# Patient Record
Sex: Male | Born: 1995 | Race: Black or African American | Hispanic: No | Marital: Single | Smoking: Former smoker
Health system: Southern US, Community
[De-identification: ages and names within clinical notes are randomized; demographics above are authoritative.]

---
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
## 2014-07-20 IMAGING — CR DG LUMBAR SPINE COMPLETE 4+V
5 series · 5 of 5 positions shown · non-contrast
Comparison: None.

CLINICAL DATA: Pain post trauma

LUMBAR SPINE - COMPLETE 4+ VIEW

[t l-spine a.p.]
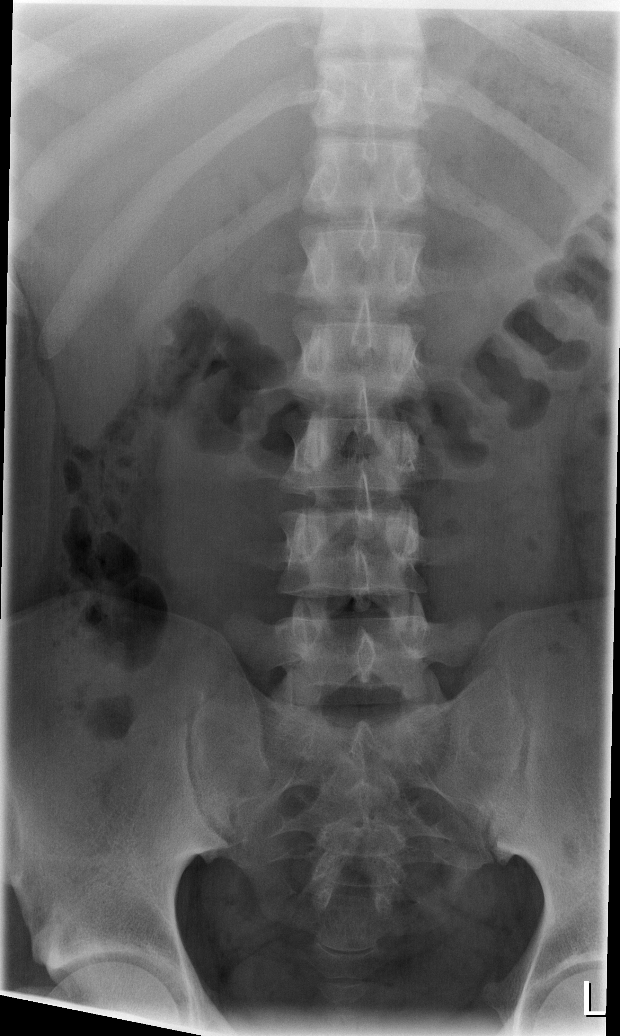

[t l-spine oblique exposure (1 of 2)]
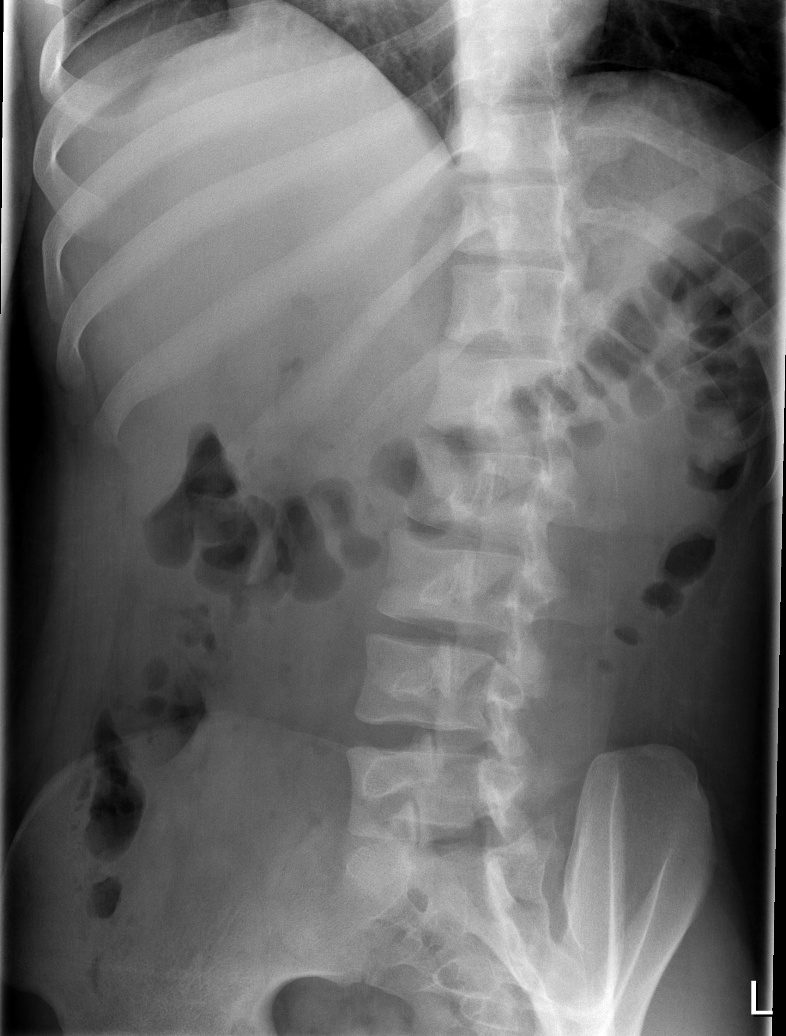

[t l-spine oblique exposure (2 of 2)]
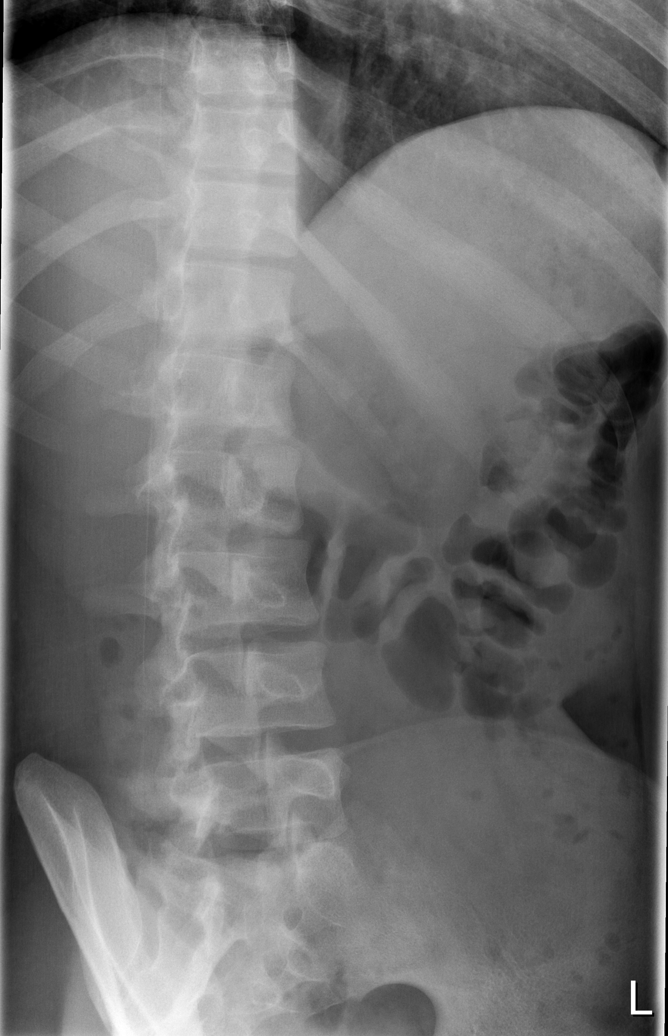

[t l-spine lat]
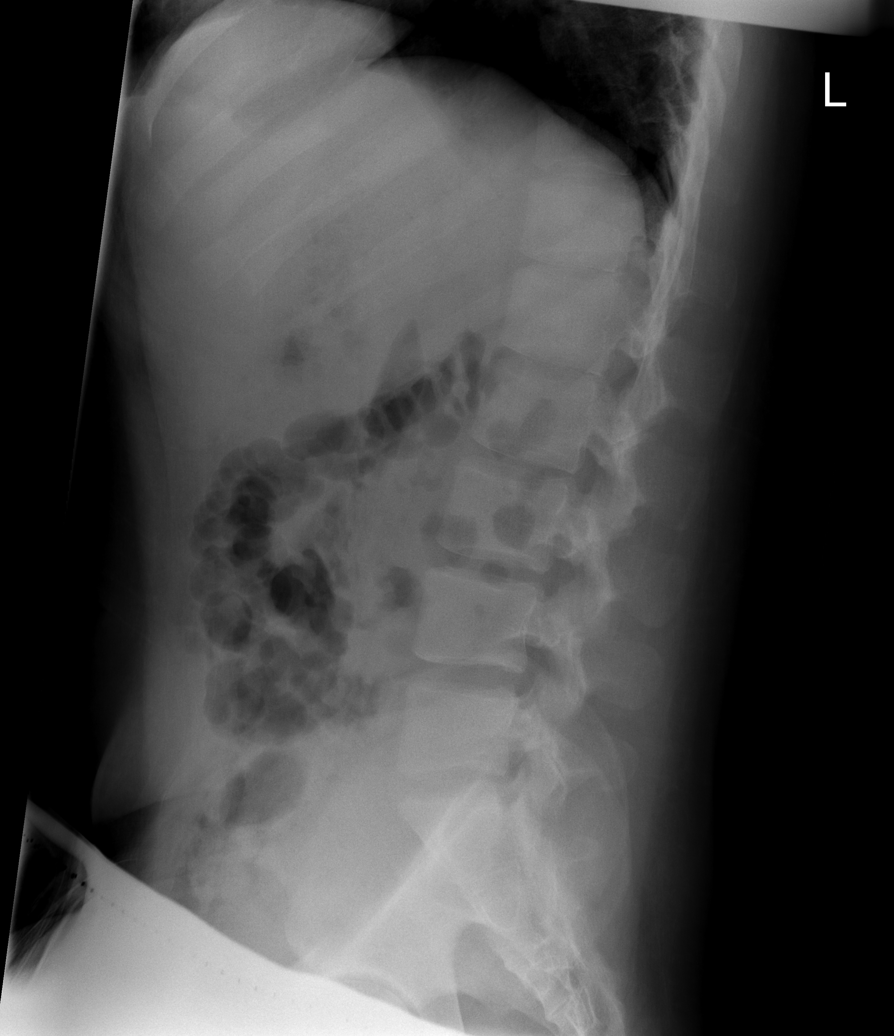

[t l-spine l5-s1 spot]
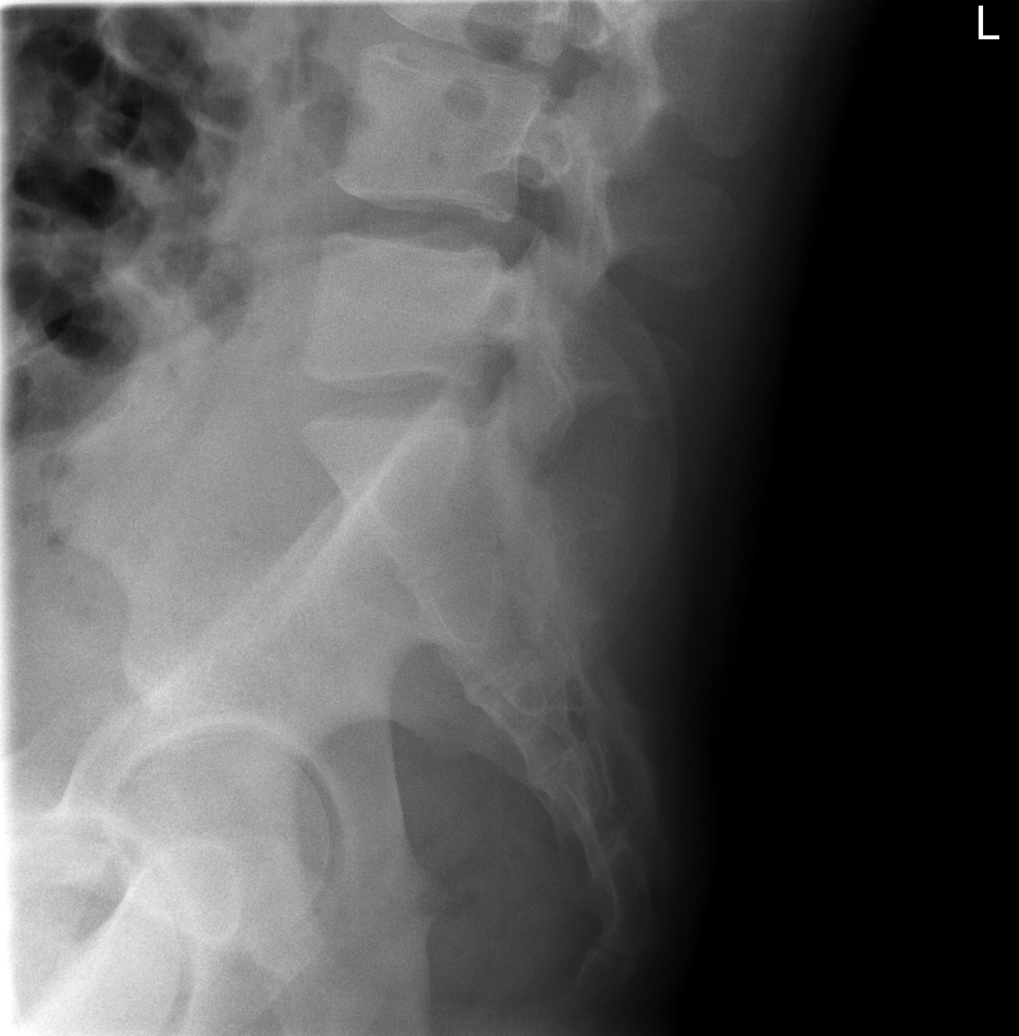

[5 of 5 positions shown; findings below may reference images not displayed]

FINDINGS: Frontal, lateral, bilateral oblique, and spot lumbosacral
lateral images were obtained.  There are five non-rib bearing
lumbar type vertebral bodies.  There is no fracture or
spondylolisthesis.  Disc spaces appear intact.  There is no
appreciable facet arthropathy.
IMPRESSION: No appreciable fracture or arthropathy.

## 2019-02-11 ENCOUNTER — Encounter (HOSPITAL_BASED_OUTPATIENT_CLINIC_OR_DEPARTMENT_OTHER): Payer: Self-pay | Admitting: Emergency Medicine

## 2019-02-11 ENCOUNTER — Emergency Department (HOSPITAL_BASED_OUTPATIENT_CLINIC_OR_DEPARTMENT_OTHER)
Admission: EM | Admit: 2019-02-11 | Discharge: 2019-02-11 | Disposition: A | Discharge status: Home / Self Care | Payer: Self-pay | Attending: Emergency Medicine | Admitting: Emergency Medicine

## 2019-02-11 ENCOUNTER — Other Ambulatory Visit: Payer: Self-pay

## 2019-02-11 DIAGNOSIS — J020 Streptococcal pharyngitis: Secondary | ICD-10-CM

## 2019-02-11 DIAGNOSIS — J0300 Acute streptococcal tonsillitis, unspecified: Secondary | ICD-10-CM | POA: Insufficient documentation

## 2019-02-11 DIAGNOSIS — Z20828 Contact with and (suspected) exposure to other viral communicable diseases: Secondary | ICD-10-CM | POA: Insufficient documentation

## 2019-02-11 DIAGNOSIS — J45909 Unspecified asthma, uncomplicated: Secondary | ICD-10-CM | POA: Insufficient documentation

## 2019-02-11 LAB — GROUP A STREP BY PCR: Group A Strep by PCR: DETECTED — AB

## 2019-02-11 MED ORDER — PENICILLIN V POTASSIUM 500 MG PO TABS: 500.0000 mg | ORAL_TABLET | Freq: Four times a day (QID) | ORAL | 0 refills | Status: AC

## 2019-02-11 NOTE — Discharge Instructions (Addendum)
You were evaluated in the Emergency Department and after careful evaluation, we did not find any emergent condition requiring admission or further testing in the hospital.  Your exam/testing today is overall reassuring.  Your test was positive for strep throat.  Please take the antibiotics as directed and use Tylenol or Motrin at home for discomfort.  We have also tested you for the coronavirus, please practice home quarantine until your test returns.  If positive, you will need to quarantine for a longer period of time per the CDC recommendations.  Please return to the Emergency Department if you experience any worsening of your condition.  We encourage you to follow up with a primary care provider.  Thank you for allowing [place] to be a part of your care.

## 2019-02-11 NOTE — ED Provider Notes (Signed)
[facility]EMERGENC[facility] DEPT [facility] Emergency Department Provider Note MRN:  [id number]  Arrival date & time: 02/11/19     Chief Complaint   Sore Throat   History of Present Illness   David Walter is a 23 y.o. year-old male with a history of asthma presenting to the ED with chief complaint of sore throat.  Mild sore throat for the past 4 days, described as scratchy, worse when swallowing.  Still eating and drinking well.  Denies fever, no cough, no sick contacts, no headache, no chest pain, no shortness of breath, no abdominal pain, no vomiting, no diarrhea.  Review of Systems  A complete 10 system review of systems was obtained and all systems are negative except as noted in the HPI and PMH.   Patient's Health History    Past Medical History:  Diagnosis Date  . Asthma     History reviewed. No pertinent surgical history.  History reviewed. No pertinent family history.  Social History   Socioeconomic History  . Marital status: Single    Spouse name: Not on file  . Number of children: Not on file  . Years of education: Not on file  . Highest education level: Not on file  Occupational History  . Not on file  Social Needs  . Financial resource strain: Not on file  . Food insecurity    Worry: Not on file    Inability: Not on file  . Transportation needs    Medical: Not on file    Non-medical: Not on file  Tobacco Use  . Smoking status: Current Every Day Smoker    Packs/day: 0.50    Types: Cigarettes  . Smokeless tobacco: Never Used  Substance and Sexual Activity  . Alcohol use: Yes  . Drug use: Not Currently    Types: Marijuana  . Sexual activity: Yes  Lifestyle  . Physical activity    Days per week: Not on file    Minutes per session: Not on file  . Stress: Not on file  Relationships  . Social Musician on phone: Not on file    Gets together: Not on file    Attends religious service: Not on file    Active member of club or  organization: Not on file    Attends meetings of clubs or organizations: Not on file    Relationship status: Not on file  . Intimate partner violence    Fear of current or ex partner: Not on file    Emotionally abused: Not on file    Physically abused: Not on file    Forced sexual activity: Not on file  Other Topics Concern  . Not on file  Social History Narrative  . Not on file     Physical Exam  Vital Signs and Nursing Notes reviewed Vitals:   02/11/19 0657 02/11/19 0810  BP: 140/77 124/80  Pulse: 70 78  Resp: 16 16  Temp: 98.3 F (36.8 C) 98.2 F (36.8 C)  SpO2:  100%    CONSTITUTIONAL: Well-appearing, NAD NEURO:  Alert and oriented x 3, no focal deficits EYES:  eyes equal and reactive ENT/NECK:  no LAD, no JVD; bilateral swollen and erythematous tonsils, mild exudate, no asymmetry CARDIO: Regular rate, well-perfused, normal S1 and S2 PULM:  CTAB no wheezing or rhonchi GI/GU:  normal bowel sounds, non-distended, non-tender MSK/SPINE:  No gross deformities, no edema SKIN:  no rash, atraumatic PSYCH:  Appropriate speech and behavior  Diagnostic and Interventional Summary  EKG Interpretation  Date/Time:    Ventricular Rate:    PR Interval:    QRS Duration:   QT Interval:    QTC Calculation:   R Axis:     Text Interpretation:        Labs Reviewed  GROUP A STREP BY PCR - Abnormal; Notable for the following components:      Result Value   Group A Strep by PCR DETECTED (*)    All other components within normal limits  NOVEL CORONAVIRUS, NAA (HOSPITAL ORDER, SEND-OUT TO REF LAB)    No orders to display    Medications - No data to display   Procedures  /  Critical Care Procedures  ED Course and Medical Decision Making  I have reviewed the triage vital signs and the nursing notes.  Pertinent labs & imaging results that were available during my care of the patient were reviewed by me and considered in my medical decision making (see below for details).   Reassuring vital signs, no cough, clear lungs, no increased work of breathing.  Suspect strep throat given the appearance of the tonsils, no signs or symptoms of abscess, awaiting rapid strep.  We will also test for Covid.  David Walter was evaluated in Emergency Department on 02/11/2019 for the symptoms described in the history of present illness. He was evaluated in the context of the global COVID-19 pandemic, which necessitated consideration that the patient might be at risk for infection with the SARS-CoV-2 virus that causes COVID-19. Institutional protocols and algorithms that pertain to the evaluation of patients at risk for COVID-19 are in a state of rapid change based on information released by regulatory bodies including the CDC and federal and state organizations. These policies and algorithms were followed during the patient's care in the ED.  Strep positive, provided with penicillin prescription, appropriate for discharge.  [name]. [place]onia Small, [place] [email or web address]  Final Clinical Impressions(s) / ED Diagnoses     ICD-10-CM   1. Strep throat  J02.0     ED Discharge Orders         Ordered    penicillin v potassium (VEETID) 500 MG tablet  4 times daily     02/11/19 0759          Discharge Instructions Discussed with and Provided to Patient:   Discharge Instructions     You were evaluated in the Emergency Department and after careful evaluation, we did not find any emergent condition requiring admission or further testing in the hospital.  Your exam/testing today is overall reassuring.  Your test was positive for strep throat.  Please take the antibiotics as directed and use Tylenol or Motrin at home for discomfort.  We have also tested you for the coronavirus, please practice home quarantine until your test returns.  If positive, you will need to quarantine for a longer period of time per the CDC recommendations.   Please return to the Emergency Department if you experience any worsening of your condition.  We encourage you to follow up with a primary care provider.  Thank you for allowing [place] to be a part of your care.       Maudie Flakes, MD 02/11/19 [phone]

## 2019-02-11 NOTE — ED Triage Notes (Signed)
Patient arrived via POV c/o sore throat x 3 days. Patient denies fever, nausea, vomiting. Patient states it "feels scratchy" with no pain while swallowing. Patient is AO x 4, normal gait, VS WDL.

## 2019-02-12 LAB — NOVEL CORONAVIRUS, NAA (HOSP ORDER, SEND-OUT TO REF LAB; TAT 18-24 HRS): SARS-CoV-2, NAA: NOT DETECTED

## 2020-04-11 ENCOUNTER — Other Ambulatory Visit: Payer: Self-pay

## 2020-04-11 ENCOUNTER — Encounter (HOSPITAL_BASED_OUTPATIENT_CLINIC_OR_DEPARTMENT_OTHER): Payer: Self-pay | Admitting: *Deleted

## 2020-04-11 ENCOUNTER — Emergency Department (HOSPITAL_BASED_OUTPATIENT_CLINIC_OR_DEPARTMENT_OTHER)
Admission: EM | Admit: 2020-04-11 | Discharge: 2020-04-11 | Disposition: A | Discharge status: Home / Self Care | Payer: Medicaid Other | Attending: Emergency Medicine | Admitting: Emergency Medicine

## 2020-04-11 DIAGNOSIS — R519 Headache, unspecified: Secondary | ICD-10-CM | POA: Insufficient documentation

## 2020-04-11 DIAGNOSIS — R070 Pain in throat: Secondary | ICD-10-CM | POA: Insufficient documentation

## 2020-04-11 DIAGNOSIS — Z5321 Procedure and treatment not carried out due to patient leaving prior to being seen by health care provider: Secondary | ICD-10-CM | POA: Insufficient documentation

## 2020-04-11 DIAGNOSIS — M545 Low back pain, unspecified: Secondary | ICD-10-CM | POA: Insufficient documentation

## 2020-04-11 NOTE — ED Provider Notes (Signed)
Patient left without being seen  1. Patient left without being seen       Melene Plan, DO 04/11/20 2308

## 2020-04-11 NOTE — ED Triage Notes (Signed)
covid symptoms x 6 hrs

## 2020-09-10 ENCOUNTER — Encounter (HOSPITAL_BASED_OUTPATIENT_CLINIC_OR_DEPARTMENT_OTHER): Payer: Self-pay | Admitting: Urology

## 2020-09-10 ENCOUNTER — Emergency Department (HOSPITAL_BASED_OUTPATIENT_CLINIC_OR_DEPARTMENT_OTHER)
Admission: EM | Admit: 2020-09-10 | Discharge: 2020-09-10 | Disposition: A | Discharge status: Home / Self Care | Payer: Medicaid Other | Attending: Emergency Medicine | Admitting: Emergency Medicine

## 2020-09-10 ENCOUNTER — Other Ambulatory Visit: Payer: Self-pay

## 2020-09-10 DIAGNOSIS — R112 Nausea with vomiting, unspecified: Secondary | ICD-10-CM | POA: Insufficient documentation

## 2020-09-10 DIAGNOSIS — F17210 Nicotine dependence, cigarettes, uncomplicated: Secondary | ICD-10-CM | POA: Insufficient documentation

## 2020-09-10 DIAGNOSIS — J45909 Unspecified asthma, uncomplicated: Secondary | ICD-10-CM | POA: Insufficient documentation

## 2020-09-10 DIAGNOSIS — R197 Diarrhea, unspecified: Secondary | ICD-10-CM | POA: Insufficient documentation

## 2020-09-10 LAB — CBC WITH DIFFERENTIAL/PLATELET
Abs Immature Granulocytes: 0.03 10*3/uL (ref 0.00–0.07)
Basophils Absolute: 0 10*3/uL (ref 0.0–0.1)
Basophils Relative: 0 %
Eosinophils Absolute: 0.2 10*3/uL (ref 0.0–0.5)
Eosinophils Relative: 1 %
HCT: 45.3 % (ref 39.0–52.0)
Hemoglobin: 15.4 g/dL (ref 13.0–17.0)
Immature Granulocytes: 0 %
Lymphocytes Relative: 17 %
Lymphs Abs: 2.1 10*3/uL (ref 0.7–4.0)
MCH: 29.1 pg (ref 26.0–34.0)
MCHC: 34 g/dL (ref 30.0–36.0)
MCV: 85.5 fL (ref 80.0–100.0)
Monocytes Absolute: 1.2 10*3/uL — ABNORMAL HIGH (ref 0.1–1.0)
Monocytes Relative: 9 %
Neutro Abs: 9.2 10*3/uL — ABNORMAL HIGH (ref 1.7–7.7)
Neutrophils Relative %: 73 %
Platelets: 329 10*3/uL (ref 150–400)
RBC: 5.3 MIL/uL (ref 4.22–5.81)
RDW: 14.2 % (ref 11.5–15.5)
WBC: 12.8 10*3/uL — ABNORMAL HIGH (ref 4.0–10.5)
nRBC: 0 % (ref 0.0–0.2)

## 2020-09-10 LAB — COMPREHENSIVE METABOLIC PANEL
ALT: 20 U/L (ref 0–44)
AST: 20 U/L (ref 15–41)
Albumin: 4.2 g/dL (ref 3.5–5.0)
Alkaline Phosphatase: 81 U/L (ref 38–126)
Anion gap: 9 (ref 5–15)
BUN: 7 mg/dL (ref 6–20)
CO2: 24 mmol/L (ref 22–32)
Calcium: 9.1 mg/dL (ref 8.9–10.3)
Chloride: 104 mmol/L (ref 98–111)
Creatinine, Ser: 0.73 mg/dL (ref 0.61–1.24)
GFR, Estimated: >60 mL/min (ref >60)
Glucose, Bld: 86 mg/dL (ref 70–99)
Potassium: 3.9 mmol/L (ref 3.5–5.1)
Sodium: 137 mmol/L (ref 135–145)
Total Bilirubin: 1 mg/dL (ref 0.3–1.2)
Total Protein: 7.8 g/dL (ref 6.5–8.1)

## 2020-09-10 MED ORDER — LOPERAMIDE HCL 2 MG PO CAPS
4.0000 mg | ORAL_CAPSULE | Freq: Once | ORAL | Status: AC
  Administered 2020-09-10: 4 mg via ORAL
  Filled 2020-09-10: qty 2

## 2020-09-10 MED ORDER — SODIUM CHLORIDE 0.9 % IV BOLUS
1000.0000 mL | Freq: Once | INTRAVENOUS | Status: AC
  Administered 2020-09-10: 1000 mL via INTRAVENOUS

## 2020-09-10 MED ORDER — LOPERAMIDE HCL 2 MG PO CAPS: 2.0000 mg | ORAL_CAPSULE | Freq: Four times a day (QID) | ORAL | 0 refills | Status: AC | PRN

## 2020-09-10 MED ORDER — ONDANSETRON HCL 4 MG PO TABS: 4.0000 mg | ORAL_TABLET | Freq: Four times a day (QID) | ORAL | 0 refills | Status: AC

## 2020-09-10 MED ORDER — ONDANSETRON HCL 4 MG/2ML IJ SOLN
4.0000 mg | Freq: Once | INTRAMUSCULAR | Status: AC
  Administered 2020-09-10: 4 mg via INTRAVENOUS
  Filled 2020-09-10: qty 2

## 2020-09-10 NOTE — ED Triage Notes (Signed)
Generalized abdominal pain since yesterday, woke up this morning at 0200 with Nausea/Vomiting, states blood noted in vomitus this morning, now having watery stools as well.

## 2020-09-10 NOTE — ED Provider Notes (Signed)
[facility] EMERGENCY DEPARTMENT Provider Note   CSN: [id number] Arrival date & time: 09/10/20  1047     History Chief Complaint  Patient presents with  . Abdominal Pain  . Diarrhea  . Emesis    David Walter is a 25 y.o. male.  He states that he woke up today having several episodes of diarrhea throughout the night and an episode of vomiting today.  He states he was 1 episode of vomiting and it was the food that he had eaten the night prior, but also contained a few "globs of blood at the end."  Patient noticed multiple episodes of loose stools throughout the night as well.  These are nonbloody.  He states he had intermittent abdominal pain, currently denies any abdominal pain.  Denies any fevers or cough.  He states that last night he ate a day old stromboli that was in his oven.        Past Medical History:  Diagnosis Date  . Asthma     There are no problems to display for this patient.   History reviewed. No pertinent surgical history.     History reviewed. No pertinent family history.  Social History   Tobacco Use  . Smoking status: Current Every Day Smoker    Packs/day: 0.50    Types: Cigarettes  . Smokeless tobacco: Never Used  Vaping Use  . Vaping Use: Never used  Substance Use Topics  . Alcohol use: Yes    Comment: occasionally   . Drug use: Yes    Types: Marijuana    Comment: occasionally     Home Medications Prior to Admission medications   Medication Sig Start Date End Date Taking? Authorizing Provider  loperamide (IMODIUM) 2 MG capsule Take 1 capsule (2 mg total) by mouth 4 (four) times daily as needed for diarrhea or loose stools. 09/10/20  Yes Cheryll Cockayne, MD  ondansetron (ZOFRAN) 4 MG tablet Take 1 tablet (4 mg total) by mouth every 6 (six) hours. 09/10/20  Yes Cheryll Cockayne, MD  ibuprofen (ADVIL,MOTRIN) 800 MG tablet Take 1 tablet (800 mg total) by mouth 3 (three) times daily. 11/17/12   Glynn Octave, MD    Allergies     Patient has no known allergies.  Review of Systems   Review of Systems  Constitutional: Negative for fever.  HENT: Negative for ear pain and sore throat.   Eyes: Negative for pain.  Respiratory: Negative for cough.   Cardiovascular: Negative for chest pain.  Gastrointestinal: Positive for diarrhea.  Genitourinary: Negative for flank pain.  Musculoskeletal: Negative for back pain.  Skin: Negative for color change and rash.  Neurological: Negative for syncope.  All other systems reviewed and are negative.   Physical Exam Updated Vital Signs BP 129/78 (BP Location: Right Arm)   Pulse 82   Temp 98.8 F (37.1 C) (Oral)   Resp 18   Ht 5\' 11"  (1.803 m)   Wt 99.8 kg   SpO2 99%   BMI 30.68 kg/m   Physical Exam Constitutional:      General: He is not in acute distress.    Appearance: He is well-developed.  HENT:     Head: Normocephalic.     Nose: Nose normal.  Eyes:     Extraocular Movements: Extraocular movements intact.  Cardiovascular:     Rate and Rhythm: Normal rate.  Pulmonary:     Effort: Pulmonary effort is normal.  Abdominal:     Tenderness: There is no abdominal  tenderness. There is no guarding or rebound.  Skin:    Coloration: Skin is not jaundiced.  Neurological:     Mental Status: He is alert. Mental status is at baseline.     ED Results / Procedures / Treatments   Labs (all labs ordered are listed, but only abnormal results are displayed) Labs Reviewed  CBC WITH DIFFERENTIAL/PLATELET - Abnormal; Notable for the following components:      Result Value   WBC 12.8 (*)    Neutro Abs 9.2 (*)    Monocytes Absolute 1.2 (*)    All other components within normal limits  COMPREHENSIVE METABOLIC PANEL    EKG None  Radiology No results found.  Procedures Procedures   Medications Ordered in ED Medications  sodium chloride 0.9 % bolus 1,000 mL (1,000 mLs Intravenous New Bag/Given 09/10/20 1121)  ondansetron (ZOFRAN) injection 4 mg (4 mg Intravenous  Given 09/10/20 1121)  loperamide (IMODIUM) capsule 4 mg (4 mg Oral Given 09/10/20 1135)    ED Course  I have reviewed the triage vital signs and the nursing notes.  Pertinent labs & imaging results that were available during my care of the patient were reviewed by me and considered in my medical decision making (see chart for details).    MDM Rules/Calculators/A&P                          Abdominal exam is benign with no guarding or tenderness.  Serial exams are done, last exam done at 12:00 with no guarding or tenderness was elicited.  Labs unremarkable like a mildly elevated 12.8 which is suspect due to his vomiting and DIARRHEA.  I doubt acute appendicitis given no localizing abdominal pain or tenderness at this time.  Recommending Zofran and Imodium at home, advised immediate return for worsening pain fevers or any additional concerns.   Final Clinical Impression(s) / ED Diagnoses Final diagnoses:  Nausea vomiting and diarrhea    Rx / DC Orders ED Discharge Orders         Ordered    ondansetron (ZOFRAN) 4 MG tablet  Every 6 hours        09/10/20 1209    loperamide (IMODIUM) 2 MG capsule  4 times daily PRN        09/10/20 1209           Cheryll Cockayne, MD 09/10/20 1210

## 2020-09-10 NOTE — Discharge Instructions (Signed)
Call your primary care doctor or specialist as discussed in the next 2-3 days.   Return immediately back to the ER if:  Your symptoms worsen within the next 12-24 hours. You develop new symptoms such as new fevers, persistent vomiting, new pain, shortness of breath, or new weakness or numbness, or if you have any other concerns.  

## 2021-05-06 ENCOUNTER — Emergency Department (HOSPITAL_BASED_OUTPATIENT_CLINIC_OR_DEPARTMENT_OTHER)
Admission: EM | Admit: 2021-05-06 | Discharge: 2021-05-06 | Disposition: A | Discharge status: Home / Self Care | Payer: Medicaid Other | Attending: Emergency Medicine | Admitting: Emergency Medicine

## 2021-05-06 ENCOUNTER — Encounter (HOSPITAL_BASED_OUTPATIENT_CLINIC_OR_DEPARTMENT_OTHER): Payer: Self-pay | Admitting: Emergency Medicine

## 2021-05-06 ENCOUNTER — Other Ambulatory Visit: Payer: Self-pay

## 2021-05-06 DIAGNOSIS — S0992XA Unspecified injury of nose, initial encounter: Secondary | ICD-10-CM | POA: Insufficient documentation

## 2021-05-06 DIAGNOSIS — X58XXXA Exposure to other specified factors, initial encounter: Secondary | ICD-10-CM | POA: Insufficient documentation

## 2021-05-06 DIAGNOSIS — R519 Headache, unspecified: Secondary | ICD-10-CM | POA: Insufficient documentation

## 2021-05-06 NOTE — ED Provider Notes (Signed)
[facility] EMERGENCY DEPARTMENT Provider Note   CSN: [id number] Arrival date & time: 05/06/21  1102     History  Chief Complaint  Patient presents with   Facial Pain   Epistaxis    David Walter is a 26 y.o. male.  Patient presents ER chief complaint of concern for nose injury due to cocaine use.  He states that has been using cocaine regularly for quite a while now.  He has noticed some discoloration and intermittent bleeding from the septum of his nose for the past 3 to 4 weeks.  He is concerned that he may have developed a hole through the septum and he presents to the ER today.  No reports of fevers or cough no vomiting or diarrhea last use of cocaine was early this morning.      Home Medications Prior to Admission medications   Medication Sig Start Date End Date Taking? Authorizing Provider  ibuprofen (ADVIL,MOTRIN) 800 MG tablet Take 1 tablet (800 mg total) by mouth 3 (three) times daily. 11/17/12   Rancour, Jeannett Senior, MD  loperamide (IMODIUM) 2 MG capsule Take 1 capsule (2 mg total) by mouth 4 (four) times daily as needed for diarrhea or loose stools. 09/10/20   Cheryll Cockayne, MD  ondansetron (ZOFRAN) 4 MG tablet Take 1 tablet (4 mg total) by mouth every 6 (six) hours. 09/10/20   Cheryll Cockayne, MD      Allergies    Patient has no known allergies.    Review of Systems   Review of Systems  Constitutional:  Negative for fever.  HENT:  Negative for ear pain and sore throat.   Eyes:  Negative for pain.  Respiratory:  Negative for cough.   Cardiovascular:  Negative for chest pain.  Gastrointestinal:  Negative for abdominal pain.  Genitourinary:  Negative for flank pain.  Musculoskeletal:  Negative for back pain.  Skin:  Negative for color change and rash.  Neurological:  Negative for syncope.  All other systems reviewed and are negative.  Physical Exam Updated Vital Signs BP (!) 143/95    Pulse 99    Temp 98.5 F (36.9 C) (Oral)    Resp 18    Ht 5\' 11"   (1.803 m)    Wt 99.8 kg    SpO2 100%    BMI 30.68 kg/m  Physical Exam Constitutional:      Appearance: He is well-developed.  HENT:     Head: Normocephalic.     Nose: Nose normal.     Comments: Grayish discoloration of the septum seen from bilateral nares.  No active bleeding noted.  No clear perforation noted.  No septal hematoma noted. Eyes:     Extraocular Movements: Extraocular movements intact.  Cardiovascular:     Rate and Rhythm: Normal rate.  Pulmonary:     Effort: Pulmonary effort is normal.  Skin:    Coloration: Skin is not jaundiced.  Neurological:     Mental Status: He is alert. Mental status is at baseline.    ED Results / Procedures / Treatments   Labs (all labs ordered are listed, but only abnormal results are displayed) Labs Reviewed - No data to display  EKG None  Radiology No results found.  Procedures Procedures    Medications Ordered in ED Medications - No data to display  ED Course/ Medical Decision Making/ A&P  Medical Decision Making  Patient counseled against cocaine use.  Recommend outpatient follow-up with ENT this week.  Advised return for worsening symptoms or any additional concerns.        Final Clinical Impression(s) / ED Diagnoses Final diagnoses:  Injury of nasal septum, initial encounter    Rx / DC Orders ED Discharge Orders     None         Cheryll Cockayne, MD 05/06/21 1213

## 2021-05-06 NOTE — ED Triage Notes (Signed)
Pt arrives pov, c/o sinus pain and congestion, with blood tinged mucus today. Pt endorse hx of cocaine abuse, endorses concern of septum injury. Last use 8 hrs pta.

## 2021-05-06 NOTE — Discharge Instructions (Signed)
Call the ENT physician to schedule an appointment.  Return back to the ER if you have worsening symptoms or any additional concerns.

## 2023-10-09 ENCOUNTER — Emergency Department (HOSPITAL_BASED_OUTPATIENT_CLINIC_OR_DEPARTMENT_OTHER)
Admission: EM | Admit: 2023-10-09 | Discharge: 2023-10-09 | Disposition: A | Discharge status: Home / Self Care | Payer: Self-pay | Attending: Emergency Medicine | Admitting: Emergency Medicine

## 2023-10-09 ENCOUNTER — Encounter (HOSPITAL_BASED_OUTPATIENT_CLINIC_OR_DEPARTMENT_OTHER): Payer: Self-pay

## 2023-10-09 ENCOUNTER — Other Ambulatory Visit: Payer: Self-pay

## 2023-10-09 DIAGNOSIS — K625 Hemorrhage of anus and rectum: Secondary | ICD-10-CM | POA: Insufficient documentation

## 2023-10-09 LAB — CBC
HCT: 42.9 % (ref 39.0–52.0)
Hemoglobin: 14.7 g/dL (ref 13.0–17.0)
MCH: 28.7 pg (ref 26.0–34.0)
MCHC: 34.3 g/dL (ref 30.0–36.0)
MCV: 83.8 fL (ref 80.0–100.0)
Platelets: 325 10*3/uL (ref 150–400)
RBC: 5.12 MIL/uL (ref 4.22–5.81)
RDW: 13.6 % (ref 11.5–15.5)
WBC: 14 10*3/uL — ABNORMAL HIGH (ref 4.0–10.5)
nRBC: 0 % (ref 0.0–0.2)

## 2023-10-09 LAB — COMPREHENSIVE METABOLIC PANEL WITH GFR
ALT: 13 U/L (ref 0–44)
AST: 19 U/L (ref 15–41)
Albumin: 5 g/dL (ref 3.5–5.0)
Alkaline Phosphatase: 97 U/L (ref 38–126)
Anion gap: 12 (ref 5–15)
BUN: 7 mg/dL (ref 6–20)
CO2: 25 mmol/L (ref 22–32)
Calcium: 9.9 mg/dL (ref 8.9–10.3)
Chloride: 100 mmol/L (ref 98–111)
Creatinine, Ser: 0.77 mg/dL (ref 0.61–1.24)
GFR, Estimated: >60 mL/min (ref >60)
Glucose, Bld: 92 mg/dL (ref 70–99)
Potassium: 3.8 mmol/L (ref 3.5–5.1)
Sodium: 137 mmol/L (ref 135–145)
Total Bilirubin: 0.8 mg/dL (ref 0.0–1.2)
Total Protein: 8 g/dL (ref 6.5–8.1)

## 2023-10-09 LAB — OCCULT BLOOD X 1 CARD TO LAB, STOOL: Fecal Occult Bld: POSITIVE — AB

## 2023-10-09 MED ORDER — POLYETHYLENE GLYCOL 3350 17 GM/SCOOP PO POWD: 1.0000 g | Freq: Every day | ORAL | 0 refills | Status: AC

## 2023-10-09 NOTE — ED Provider Notes (Signed)
 [place] [facility]ERGENC[facility] DEPARTMENT AT [facility] Provider Note   CSN: [id number] Arrival date & time: 10/09/23  1134     Patient presents with: Blood In Stools   David Walter is a 28 y.o. male.   HPI  Patient is a 28 year old male with no pertinent past medical history presents emergency room today with complaints of approximately 1 month of BRBPR  Patient states that he does strain when he poops.  He denies any rectal pain.  No anal receptive sex.  He has not had any abdominal pain chest pain difficulty breathing no lightheadedness or dizziness.  He states that he came to the emergency room today because of the persistence of having blood in his stool.  He states that his intermittent - is not present every BM - but is present nearly daily.     Prior to Admission medications   Medication Sig Start Date End Date Taking? Authorizing Provider  polyethylene glycol powder (GLYCOLAX/MIRALAX) 17 GM/SCOOP powder Take 1 g by mouth daily. One scoop in beverage of your choice with each meal. You may increase if you continue to be constipated and decrease if your stool becomes too loose. 10/09/23 02/05/24 Yes Kaleeah Gingerich S, PA  ibuprofen  (ADVIL ,MOTRIN ) 800 MG tablet Take 1 tablet (800 mg total) by mouth 3 (three) times daily. 11/17/12   Rancour, Garnette, MD  loperamide  (IMODIUM ) 2 MG capsule Take 1 capsule (2 mg total) by mouth 4 (four) times daily as needed for diarrhea or loose stools. 09/10/20   Phebe Fonda RAMAN, MD  ondansetron  (ZOFRAN ) 4 MG tablet Take 1 tablet (4 mg total) by mouth every 6 (six) hours. 09/10/20   Phebe Fonda RAMAN, MD    Allergies: Patient has no known allergies.    Review of Systems  Updated Vital Signs BP 123/79 (BP Location: Right Arm)   Pulse 76   Temp 98.3 F (36.8 C) (Oral)   Resp 16   Wt 81.6 kg   SpO2 99%   BMI 25.10 kg/m   Physical Exam Vitals and nursing note reviewed.  Constitutional:      General: He is not in acute distress.    Comments:  Pleasant well-appearing 28 year old.  In no acute distress.  Sitting comfortably in bed.  Able answer questions appropriately follow commands. No increased work of breathing. Speaking in full sentences.   HENT:     Head: Normocephalic and atraumatic.     Nose: Nose normal.     Mouth/Throat:     Mouth: Mucous membranes are moist.  Eyes:     General: No scleral icterus. Cardiovascular:     Rate and Rhythm: Normal rate and regular rhythm.     Pulses: Normal pulses.     Heart sounds: Normal heart sounds.  Pulmonary:     Effort: Pulmonary effort is normal. No respiratory distress.     Breath sounds: No wheezing.  Abdominal:     Palpations: Abdomen is soft.     Tenderness: There is no abdominal tenderness. There is no guarding or rebound.  Genitourinary:    Comments: RN Wes present for rectal exam  Empty rectal vault Positive hemoccult  Musculoskeletal:     Cervical back: Normal range of motion.     Right lower leg: No edema.     Left lower leg: No edema.  Skin:    General: Skin is warm and dry.     Capillary Refill: Capillary refill takes less than 2 seconds.  Neurological:  Mental Status: He is alert. Mental status is at baseline.  Psychiatric:        Mood and Affect: Mood normal.        Behavior: Behavior normal.     (all labs ordered are listed, but only abnormal results are displayed) Labs Reviewed  CBC - Abnormal; Notable for the following components:      Result Value   WBC 14.0 (*)    All other components within normal limits  OCCULT BLOOD X 1 CARD TO LAB, STOOL - Abnormal; Notable for the following components:   Fecal Occult Bld POSITIVE (*)    All other components within normal limits  COMPREHENSIVE METABOLIC PANEL WITH GFR  POC OCCULT BLOOD, ED    EKG: None  Radiology: No results found.   Procedures   Medications Ordered in the ED - No data to display  Clinical Course as of 10/09/23 1729  Wed Oct 09, 2023  1243 Intermittent blood (BRBPR) for 1  month Drops into toilet bowl.  Sometimes strains when poops.  No rectal pain.  [WF]    Clinical Course User Index [WF] Neldon Hamp RAMAN, PA                                 Medical Decision Making Amount and/or Complexity of Data Reviewed Labs: ordered.   Patient is a 28 year old male with no pertinent past medical history presents emergency room today with complaints of approximately 1 month of BRBPR  Patient states that he does strain when he poops.  He denies any rectal pain.  No anal receptive sex.  He has not had any abdominal pain chest pain difficulty breathing no lightheadedness or dizziness.  He states that he came to the emergency room today because of the persistence of having blood in his stool.  He states that his intermittent - is not present every BM - but is present nearly daily.  Exam without abdominal tenderness.  No guarding or rebound.  CBC with hemoglobin at approximately baseline at 14.7, positive occult stool card however no BRBPR on my rectal exam.  Empty rectal vault.  CMP NML  Patient given strict return precautions to the emergency department.  He is with normal vital signs, well-appearing, no abdominal tenderness.  Hemoglobin is reassuring.  Stool exam unremarkable but that is positive and patient has been having BRBPR therefore GI follow-up.  Return precautions were able to be taught back by patient.  Will start him on stool softener.   Final diagnoses:  Rectal bleeding    ED Discharge Orders          Ordered    polyethylene glycol powder (GLYCOLAX/MIRALAX) 17 GM/SCOOP powder  Daily        10/09/23 1446    Ambulatory referral to Gastroenterology        10/09/23 1527               Neldon Hamp RAMAN, [place] 10/09/23 1730    Bari Roxie HERO, DO 10/10/23 1414

## 2023-10-09 NOTE — Discharge Instructions (Addendum)
 You will need to follow-up with a gastroenterologist.  I have given you the information for a gastroenterologist here in [facility].  Please call to make an appointment.  Please use MiraLAX 1 scoop daily and titrate up to 3 scoops daily as needed for soft stool.  Space these out so you are taking them with breakfast lunch and dinner.  Make sure you are drinking plenty of water.  Return to the emergency room for new or concerning symptoms such as difficulty breathing lightheadedness dizziness or any other new or concerning symptoms.

## 2023-10-09 NOTE — ED Triage Notes (Signed)
 Pt reports for the past 2 months has seen blood in stools. Described as bright red in toilet. Over the past week blood with every BM. Denies pain. Blood noted in stool today

## 2023-10-09 NOTE — ED Notes (Signed)
 2x attempts by techs for blood without success.

## 2023-10-09 NOTE — ED Notes (Signed)
 Witnessed provider for rectal exam and sample POC.

## 2024-02-11 ENCOUNTER — Encounter (HOSPITAL_BASED_OUTPATIENT_CLINIC_OR_DEPARTMENT_OTHER): Payer: Self-pay

## 2024-02-11 ENCOUNTER — Other Ambulatory Visit: Payer: Self-pay

## 2024-02-11 ENCOUNTER — Emergency Department (HOSPITAL_BASED_OUTPATIENT_CLINIC_OR_DEPARTMENT_OTHER)
Admission: EM | Admit: 2024-02-11 | Discharge: 2024-02-11 | Disposition: A | Discharge status: Home / Self Care | Payer: Self-pay | Attending: Emergency Medicine | Admitting: Emergency Medicine

## 2024-02-11 DIAGNOSIS — J069 Acute upper respiratory infection, unspecified: Secondary | ICD-10-CM | POA: Insufficient documentation

## 2024-02-11 DIAGNOSIS — J45909 Unspecified asthma, uncomplicated: Secondary | ICD-10-CM | POA: Insufficient documentation

## 2024-02-11 LAB — RESP PANEL BY RT-PCR (RSV, FLU A&B, COVID)  RVPGX2
Influenza A by PCR: NEGATIVE
Influenza B by PCR: NEGATIVE
Resp Syncytial Virus by PCR: NEGATIVE
SARS Coronavirus 2 by RT PCR: NEGATIVE

## 2024-02-11 LAB — GROUP A STREP BY PCR: Group A Strep by PCR: NOT DETECTED

## 2024-02-11 MED ORDER — BENZOCAINE 20 % MT AERO
INHALATION_SPRAY | Freq: Once | OROMUCOSAL | Status: AC
  Administered 2024-02-11: 1 via OROMUCOSAL
  Filled 2024-02-11: qty 57

## 2024-02-11 MED ORDER — ACETAMINOPHEN 325 MG PO TABS
650.0000 mg | ORAL_TABLET | Freq: Once | ORAL | Status: AC
  Administered 2024-02-11: 650 mg via ORAL
  Filled 2024-02-11: qty 2

## 2024-02-11 MED ORDER — BENZONATATE 100 MG PO CAPS: 100.0000 mg | ORAL_CAPSULE | Freq: Three times a day (TID) | ORAL | 0 refills | Status: AC

## 2024-02-11 NOTE — ED Triage Notes (Signed)
 C/o fatigue x 1 week, cough, sore throat, loss of taste for a few days.

## 2024-02-11 NOTE — ED Provider Notes (Signed)
 [place] [facility]ENC[facility] DE[facility]MENT AT [facility] Provider Note   CSN: [id number] Arrival date & time: 02/11/24  9177     Patient presents with: URI   David Walter is a 28 y.o. male.   URI Presenting symptoms: congestion, cough and sore throat    Patient is a 28 year old male presenting ED today for concerns for cough, congestion, odynophagia, generalized bodyaches and fatigue present x 1 week.  Additionally has noted that David Walter has lost a sense of taste.  No known sick contacts at work.  Notes that cough has been productive, green sputum.  Has not needed to use albuterol inhaler.  Previous medical history of asthma  Denies fever, headache, vision changes, otalgia, dysphagia, chest pain, shortness of breath, abdominal pain, nausea, vomiting, diarrhea, hematochezia, melena, dysuria, rashes, lower leg swelling.    Prior to Admission medications   Medication Sig Start Date End Date Taking? Authorizing Provider  benzonatate (TESSALON) 100 MG capsule Take 1 capsule (100 mg total) by mouth every 8 (eight) hours. 02/11/24  Yes Lakisa Lotz S, PA-C  ibuprofen  (ADVIL ,MOTRIN ) 800 MG tablet Take 1 tablet (800 mg total) by mouth 3 (three) times daily. 11/17/12   Rancour, Garnette, MD  loperamide  (IMODIUM ) 2 MG capsule Take 1 capsule (2 mg total) by mouth 4 (four) times daily as needed for diarrhea or loose stools. 09/10/20   Phebe Fonda RAMAN, MD  ondansetron  (ZOFRAN ) 4 MG tablet Take 1 tablet (4 mg total) by mouth every 6 (six) hours. 09/10/20   Phebe Fonda RAMAN, MD    Allergies: Patient has no known allergies.    Review of Systems  HENT:  Positive for congestion and sore throat.   Respiratory:  Positive for cough.   All other systems reviewed and are negative.   Updated Vital Signs BP 117/77 (BP Location: Right Arm)   Pulse 96   Temp 99.2 F (37.3 C) (Oral)   Resp 16   Ht 5' 11 (1.803 m)   SpO2 98%   BMI 25.10 kg/m   Physical Exam Vitals and nursing note reviewed.   Constitutional:      General: David Walter is not in acute distress.    Appearance: Normal appearance. David Walter is not ill-appearing or diaphoretic.  HENT:     Head: Normocephalic and atraumatic.     Right Ear: Tympanic membrane, ear canal and external ear normal.     Left Ear: Tympanic membrane, ear canal and external ear normal.     Nose: Congestion and rhinorrhea present.     Mouth/Throat:     Mouth: Mucous membranes are moist.     Pharynx: Oropharyngeal exudate and posterior oropharyngeal erythema present.     Comments: Noted to have white exudate on tonsils bilaterally, 1+, with erythema.  Uvula is midline, no visible abscess or voice change present. Eyes:     General: No scleral icterus.       Right eye: No discharge.        Left eye: No discharge.     Extraocular Movements: Extraocular movements intact.     Conjunctiva/sclera: Conjunctivae normal.     Pupils: Pupils are equal, round, and reactive to light.  Cardiovascular:     Rate and Rhythm: Normal rate and regular rhythm.     Pulses: Normal pulses.     Heart sounds: Normal heart sounds. No murmur heard.    No friction rub. No gallop.  Pulmonary:     Effort: Pulmonary effort is normal. No respiratory distress.  Breath sounds: No stridor. No wheezing, rhonchi or rales.  Chest:     Chest wall: No tenderness.  Abdominal:     General: Abdomen is flat. There is no distension.     Palpations: Abdomen is soft.     Tenderness: There is no abdominal tenderness. There is no right CVA tenderness, left CVA tenderness, guarding or rebound.  Musculoskeletal:        General: No swelling, deformity or signs of injury.     Cervical back: Normal range of motion and neck supple. No rigidity or tenderness.     Right lower leg: No edema.     Left lower leg: No edema.  Lymphadenopathy:     Cervical: Cervical adenopathy (Anterior) present.  Skin:    General: Skin is warm and dry.     Findings: No bruising, erythema or lesion.  Neurological:      General: No focal deficit present.     Mental Status: David Walter is alert and oriented to person, place, and time.     Motor: No weakness.     Coordination: Coordination normal.     Gait: Gait normal.  Psychiatric:        Mood and Affect: Mood normal.     (all labs ordered are listed, but only abnormal results are displayed) Labs Reviewed  RESP PANEL BY RT-PCR (RSV, FLU A&B, COVID)  [id number]  GROUP A STREP BY PCR    EKG: None  Radiology: No results found.  Procedures   Medications Ordered in the ED  Benzocaine (HURRCAINE) 20 % mouth spray (1 Application Mouth/Throat Given 02/11/24 0926)  acetaminophen (TYLENOL) tablet 650 mg (650 mg Oral Given 02/11/24 0926)    Medical Decision Making  This patient is a 28 year old male who presents to the ED for concern of cough, congestion, odynophagia, loss of taste, generalized bodyaches and fatigue x 1 week.  No known sick contacts.  On physical exam, patient is in no acute distress, afebrile, alert and orient x 4, speaking in full sentences, nontachypneic, nontachycardic.  LCTAB, RRR, no murmur, no abdominal tenderness to palpation, no CVA tenderness.  Outside of loss of smell, normal neuroexam.  Oropharynx notably did have mild tonsillar swelling, 1+ bilaterally as well as white exudate and erythema present.  Congestion also present in nasal passages.  Unremarkable exam otherwise.  With patient's well-appearing presentation, URI-like symptoms and having tonsillar exudate/erythema with mild swelling and cervical lymphadenopathy with congestion, suspect likely URI/mononucleosis.  Low suspicion for pneumonia, PTA, retropharyngeal abscess, Ludwig's angina.  Rule out strep and respiratory panel done.  Strep test unremarkable.  Respiratory panel unremarkable.  Will have patient to follow-up with health department and return to the ED for any new or worsening symptoms.  Patient vital signs have remained stable throughout the course of patient's time in  the ED. Low suspicion for any other emergent pathology at this time. I believe this patient is safe to be discharged. Provided strict return to ER precautions. Patient expressed agreement and understanding of plan. All questions were answered.  Differential diagnoses prior to evaluation: The emergent differential diagnosis includes, but is not limited to,  upper respiratory infection, lower respiratory infection, allergies, asthma, irritants, sinus/esophageal foreign body, interstitial lung disease  . This is not an exhaustive differential.   Past Medical History / Co-morbidities / Social History: Asthma  Additional history: Chart reviewed. Pertinent results include:   Patient was seen in the emergency department on 10/09/2023 for rectal bleeding with positive occult stool card.  However  had normal hemoglobin, told to follow-up with GI and started on polyethylene glycol powder.  Lab Tests/Imaging studies: I personally interpreted labs/imaging and the pertinent results include:     Respiratory panel unremarkable  Strep test unremarkable.   I agree with the radiologist interpretation.    Medications: I ordered medication including Tylenol, benzocaine.  I have reviewed the patients home medicines and have made adjustments as needed.  Critical Interventions: None  Social Determinants of Health: Does not have insurance, does not have PCP  Disposition: After consideration of the diagnostic results and the patients response to treatment, I feel that the patient would benefit from discharge and treatment as above.   emergency department workup does not suggest an emergent condition requiring admission or immediate intervention beyond what has been performed at this time. The plan is: Follow-up with health department, return to the ER for new or worsening symptoms. The patient is safe for discharge and has been instructed to return immediately for worsening symptoms, change in symptoms or any  other concerns.   Final diagnoses:  Viral URI with cough    ED Discharge Orders          Ordered    benzonatate (TESSALON) 100 MG capsule  Every 8 hours        02/11/24 0928               Cliffie Gingras S, PA-C 02/11/24 1008    Long, Joshua G, MD 02/12/24 1034

## 2024-02-11 NOTE — Discharge Instructions (Addendum)
 You were seen today for viral URI, if symptoms persist or if you start to develop fever, recommend you continue to follow-up with PCP and/or health department for further evaluation.  Continue to stay well-hydrated drinking regular fluids approximately at least 8 cups of water a day.  I sent in some cough medication for you.  Additionally you can use Tylenol and ibuprofen  for additional relief.  Take Tylenol (acetominophen)  650mg  every 4-6 hours, as needed for pain or fever. Do not take more than 4,000 mg in a 24-hour period. As this may cause liver damage. While this is rare, if you begin to develop yellowing of the skin or eyes, stop taking and return to ER immediately.  Take Ibuprofen  400mg  every 4-6 hours for pain or fever, not exceeding 3,200 mg per day as more than 3,200mg  can cause Stomach irritation, dizziness, kidney issues with long-term use.  If you do have any new or worsening symptoms or include uncontrollable fever, shortness of breath, chest pain, painful urination, rashes, uncontrollable pain, thank you please return to the ED for further evaluation

## 2024-02-11 NOTE — ED Notes (Signed)
 Discharge instructions reviewed with patient. Patient verbalizes understanding, no further questions at this time. Medications/prescriptions and follow up information provided. No acute distress noted at time of departure.
